# Patient Record
Sex: Female | Born: 1950 | Race: White | Hispanic: No | Marital: Married
Health system: Southern US, Community
[De-identification: ages and names within clinical notes are randomized; demographics above are authoritative.]

## PROBLEM LIST (undated history)

## (undated) DIAGNOSIS — E079 Disorder of thyroid, unspecified: Secondary | ICD-10-CM

## (undated) HISTORY — DX: Disorder of thyroid, unspecified: E07.9

---

## 2003-09-17 ENCOUNTER — Inpatient Hospital Stay (HOSPITAL_COMMUNITY): Admission: EM | Admit: 2003-09-17 | Discharge: 2003-09-23 | Payer: Self-pay | Admitting: Emergency Medicine

## 2003-09-18 ENCOUNTER — Encounter (INDEPENDENT_AMBULATORY_CARE_PROVIDER_SITE_OTHER): Payer: Self-pay | Admitting: Specialist

## 2004-02-29 ENCOUNTER — Ambulatory Visit (HOSPITAL_COMMUNITY): Admission: RE | Admit: 2004-02-29 | Discharge: 2004-02-29 | Payer: Self-pay | Admitting: Family Medicine

## 2004-03-04 ENCOUNTER — Ambulatory Visit (HOSPITAL_COMMUNITY): Admission: RE | Admit: 2004-03-04 | Discharge: 2004-03-04 | Payer: Self-pay | Admitting: Gastroenterology

## 2004-05-17 ENCOUNTER — Ambulatory Visit (HOSPITAL_COMMUNITY): Admission: RE | Admit: 2004-05-17 | Discharge: 2004-05-17 | Payer: Self-pay | Admitting: Family Medicine

## 2005-05-06 IMAGING — CT CT ABDOMEN W/ CM
1 of 4 series · 14 of 32 positions shown, 19 images · IV contrast (APPLIED)
Comparison: none

CLINICAL DATA: CT ABDOMEN WITH CONTRAST
Multidetector helical scans through the abdomen were performed after oral and IV contrast media were given.  150 cc of Omnipaque 300 were given as a contrast media.
The lung bases are clear.  The liver enhances normally with no intrahepatic ductal dilatation.  There is a small amount of fluid both medial and lateral to the liver.  The pancreas is grossly normal as are the adrenal glands and the spleen.  The kidneys enhance normal and on delayed images, the pelvocaliceal systems are normal.  No adenopathy is seen.  The abdominal aorta is normal in caliber.  
IMPRESSION 
Small amount of fluid surrounds the liver.  No acute intraabdominal abnormality seen.
CT PELVIS
Scans were continued through the pelvis after oral and IV contrast media were given. There is dilatation of small bowel loops within the pelvis.  In addition, in the right lower quadrant-right bony pelvis, there is fatty mass which appears to extend from the terminal ileum into the cecum.  This has attenuation in the minus 100 Hounsfield units and probably represents intussusception by lipoma of the distal terminal ileum into the cecum, creating a partial small bowel obstruction presently.  Urinary bladder is unremarkable.  There is a small amount of free fluid layering in the pelvis.  The appendix is not seen with certainty.
Prominent loops of small bowel in the pelvis with fatty mass at the terminal ileum-ileocecal valve region suspicious for intussusception of a lipoma from the distal small bowel into the cecum creating a partial small bowel obstruction. This report was called to Dr. Seydi at the time of interpretation.
Small amount of free fluid in the pelvis.

[Series 2: abd/pelvis 5.0 b30f · axial · 0.68mm/px · z∈[-509,-129]mm · 14 of 86 slices shown, 19 images]
[im 5/86  soft-tissue]
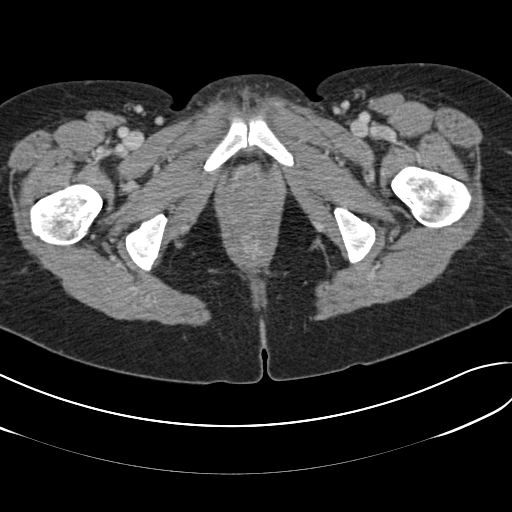
[im 5/86  bone]
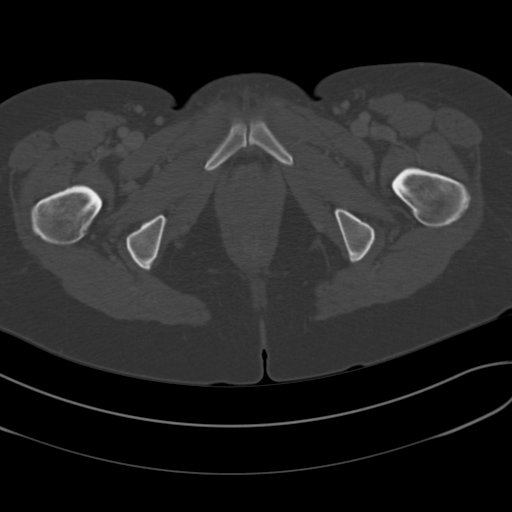
[im 10/86  soft-tissue]
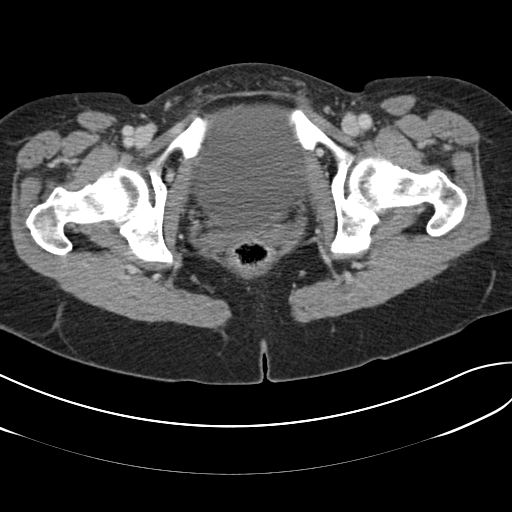
[im 19/86  soft-tissue]
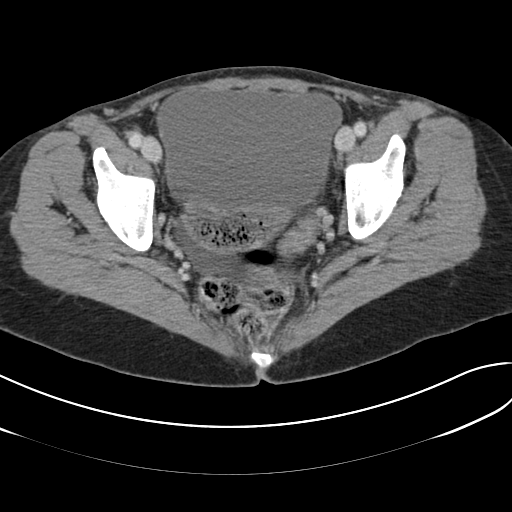
[im 24/86  soft-tissue]
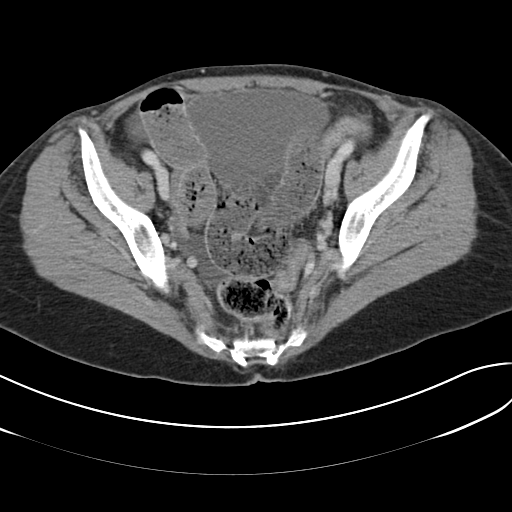
[im 29/86  soft-tissue]
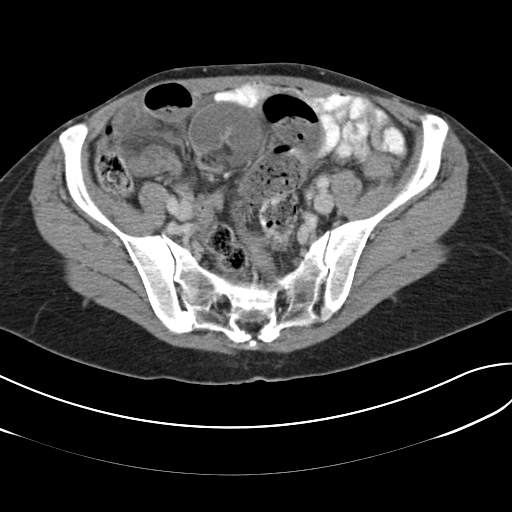
[im 38/86  soft-tissue]
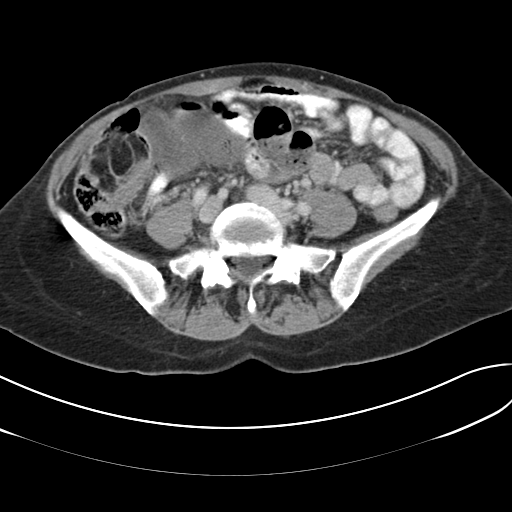
[im 43/86  soft-tissue]
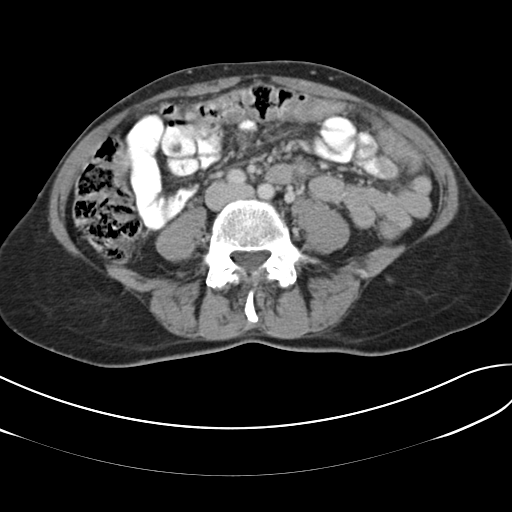
[im 48/86  soft-tissue]
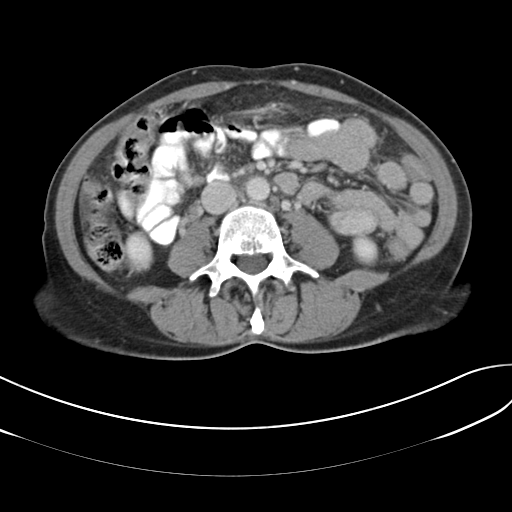
[im 57/86  soft-tissue]
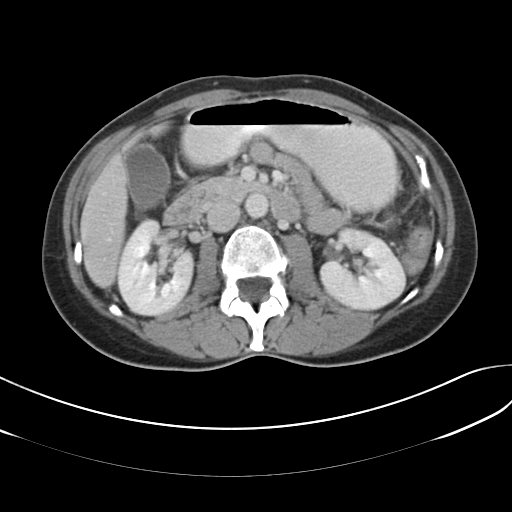
[im 57/86  bone]
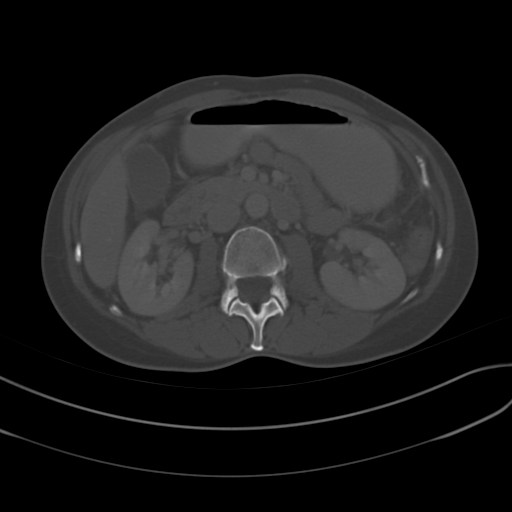
[im 62/86  soft-tissue]
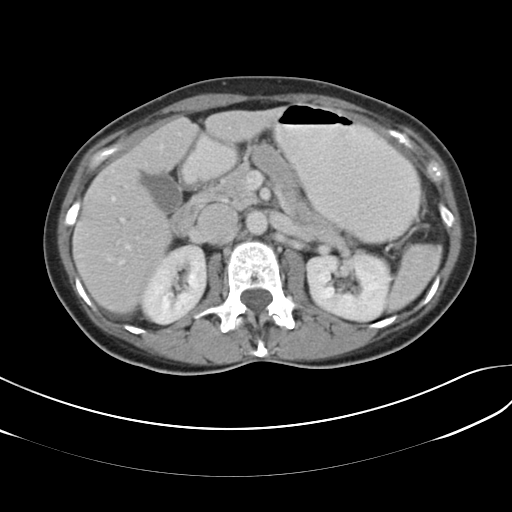
[im 67/86  soft-tissue]
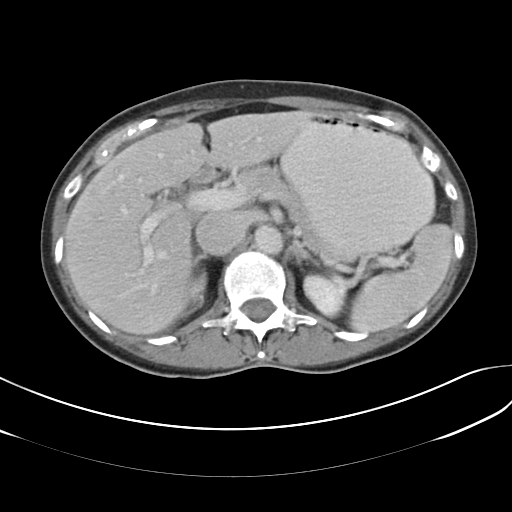
[im 67/86  lung]
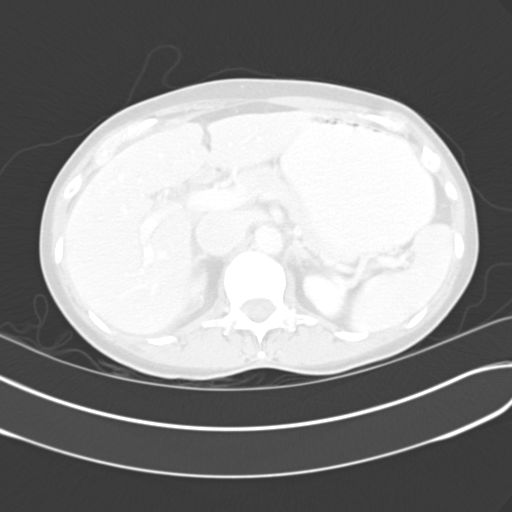
[im 71/86  lung]
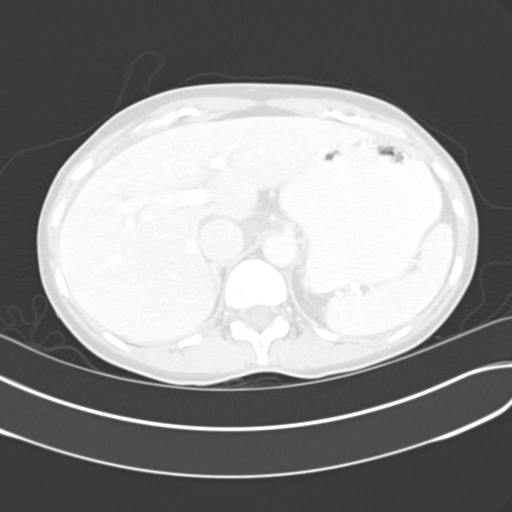
[im 76/86  soft-tissue]
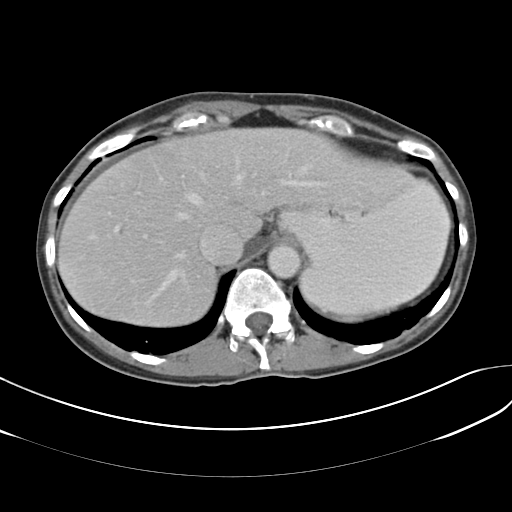
[im 76/86  lung]
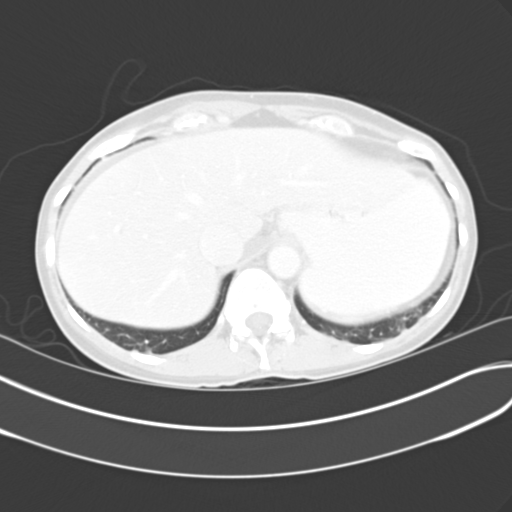
[im 81/86  soft-tissue]
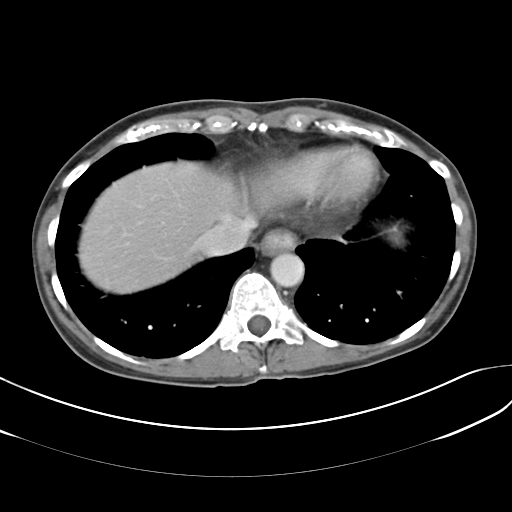
[im 81/86  lung]
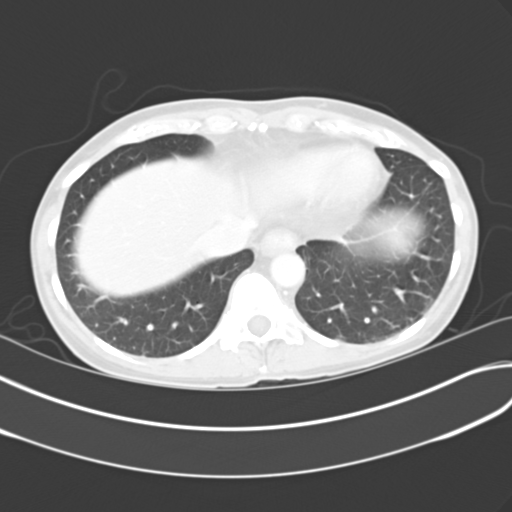

[14 of 32 positions shown; findings below may reference images not displayed]

## 2006-09-04 ENCOUNTER — Ambulatory Visit (HOSPITAL_COMMUNITY): Admission: RE | Admit: 2006-09-04 | Discharge: 2006-09-04 | Payer: Self-pay | Admitting: Family Medicine

## 2006-11-17 ENCOUNTER — Encounter: Admission: RE | Admit: 2006-11-17 | Discharge: 2006-11-17 | Payer: Self-pay | Admitting: Family Medicine

## 2010-10-04 NOTE — Op Note (Signed)
[name], HANOVER              ACCOUNT NO.:  [id number]   MEDICAL RECORD NO.:  [id number]          PATIENT TYPE:  AMB   LOCATION:  ENDO                         FACILITY:  [facility]   PHYSICIAN:  Bernette Redbird, M.D.   DATE OF BIRTH:  1950/09/29   DATE OF PROCEDURE:  03/04/2004  DATE OF DISCHARGE:                                 OPERATIVE REPORT   PROCEDURE:  Colonoscopy.   INDICATIONS:  Screening for colon cancer in a patient without risk factors  or symptoms but who is several months postoperative for an intussusception  terminal ileal lipoma necessitating terminal ileum resection.   FINDINGS:  Sigmoid diverticulosis.   PROCEDURE:  The nature, purpose, and risks of the procedure had been  discussed with the patient who provided written consent. Sedation was  fentanyl 50 mcg and Versed 5 mg IV without arrhythmias or desaturation. The  Olympus adjustable tension pediatric video colonoscope was advanced to the  cecum, with a fair amount of difficulty due to some fixation of the colon  probably related to adhesions from her prior hysterectomy. The orifice at  the terminal ileum was noted, and I was able to see into the terminal ileum  for a short distance, noting normal mucosal and no evidence of polypoid  lesions. Pull back was then performed. The quality of the prep was  excellent, and it was felt that all areas were well seen.   There was some mild sigmoid diverticulosis. No polyps, cancer, colitis, or  vascular malformations. Retroflexion was not attempted due to a small rectal  ampulla, but antegrade viewing disclosed no lesions. The lower portion of  the rectum was reinspected without additional findings. The scope was  removed from the patient who tolerated the procedure well and without  apparent complications.   IMPRESSION:  1.  Screening exam in a standard risk individual, without worrisome findings      (V76.51).  2.  Sigmoid diverticulosis.   PLAN:  Consider flexible  sigmoidoscopy in 5 years for ongoing screening.      RB/MEDQ  D:  03/04/2004  T:  03/04/2004  Job:  [id number]   cc:   Velora Heckler, MD  [address] [place]  [place] [address]  Fax: [phone]   Joycelyn Rua, M.D.  [address] [place]  [place] [address]  Fax: [phone]

## 2010-10-04 NOTE — H&P (Signed)
NAME:  ZETA, BUCY NO.:  [id number]   MEDICAL RECORD NO.:  [id number]                   PATIENT TYPE:  EMS   LOCATION:  ED                                   FACILITY:  [facility]   PHYSICIAN:  Velora Heckler, M.D.                DATE OF BIRTH:  1950/09/12   DATE OF ADMISSION:  09/17/2003  DATE OF DISCHARGE:                                HISTORY & PHYSICAL   REFERRING PHYSICIAN:  Bethann Berkshire, MD, emergency department.   CHIEF COMPLAINT:  Abdominal pain, small bowel obstruction.   HISTORY OF PRESENT ILLNESS:  Patient is a 60 year old white female recently  relocated from [place] to [place], who presents to the emergency  department early on the morning of May 1 with lower abdominal pain.  Patient  has had two prior episodes of similar symptoms, 3-1/2 and 4 years ago.  Workup in [place] and [place] was unrevealing.  Patient was awakened from  sleep early this morning by sudden onset of acute lower abdominal pain.  This was associated with nausea and vomiting.  She denied fevers and chills.  She had a normal bowel movement yesterday.  She denies any history of  bleeding per rectum.  She notes no change in her bowel habits.  She  presented to the emergency department at [facility].  Laboratory  studies were largely normal with the exception of a slightly elevated  amylase with a normal lipase.  CT scan of the abdomen and pelvis was  obtained.  This shows a soft tissue mass which appears to be a lipoma in the  distal ileum, which appears to have intussuscepted through the ileocecal  valve into the cecum and is obstructing at the level of the ileocecal valve.  Patient was seen by Dr. Roosvelt Harps in consultation.  General surgery  was then called for resection of the mass and relief of bowel obstruction.   PAST MEDICAL HISTORY:  1. Status post abdominal hysterectomy in 2001 at [facility].  2. 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   MEDICATIONS:  Thyroid hormone replacement.   ALLERGIES:  None known.   SOCIAL HISTORY:  Patient is married.  She is accompanied by her husband.  They have recently relocated to [facility].  They are buying [employer].  Patient denies tobacco use.  She drinks a glass of wine on rare  occasion.   FAMILY HISTORY:  Both parents deceased from congestive heart failure.   REVIEW OF SYSTEMS:  The 15-system review without significant other problems  except as noted above.  No history of ulcerative colitis or inflammatory  bowel disease.   PHYSICAL EXAMINATION:  VITAL SIGNS:  Temp 97.5, pulse 77, respirations 18,  blood pressure 143/94.  O2 saturation 99% on room air.  GENERAL:  A 60 year old well-developed and well-nourished white female on a  stretcher in the emergency department.  Somnolent.  HEENT:  Normocephalic and atraumatic.  Sclerae are clear.  Conjunctivae are  clear. Pupils are equal and reactive.  Dentition is good.  Mucous membranes  are moist.  NECK:  A well-healed surgical wound anteriorly.  Palpation shows no  nodularity.  There is no lymphadenopathy.  There are no masses.  There is no  tenderness.  LUNGS:  Clear to auscultation bilaterally without rales, rhonchi or wheezes.  HEART:  Regular rate and rhythm without murmur.  Peripheral pulses are full.  ABDOMEN:  Soft without distention.  Bowel sounds are present.  There is  tenderness to palpation in the right lower quadrant.  There is mild  tenderness to percussion in the right lower quadrant. There is voluntary  guarding in the right lower quadrant.  There is no rebound.  Mass is not  appreciated on physical exam.  There is no sign of hernia.  There is a well-  healed Pfannenstiel incision.  EXTREMITIES:  Nontender without edema.  NEUROLOGIC:  Patient is alert and oriented without focal deficit.   LABORATORY STUDIES:  White count 8.3, hemoglobin 15.0, platelets  215,000.  Differential shows 85% neutrophils, 10% lymphocytes.  Urinalysis is benign.  Chemistry profile is notable for an SGOT of 52 but otherwise completely  within normal limits.  Amylase is elevated at 150.  Lipase is normal at 21.   RADIOGRAPHIC STUDIES:  CT scan of the abdomen and pelvis reviewed with the  radiologist.  This shows a soft tissue mass measuring 6-8 cm in greatest  dimension, extending into the ileocecal valve and into the cecum.  It  appears to be intussusceptive.  It is partially obstructing.  Distal small  bowel is dilated.  This is consistent with an ileal lipoma which has  intussuscepted through the ileocecal valve, resulting in partial small bowel  obstruction.   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   PLAN:  1. Admission to [facility].  2. N.P.O. with intravenous hydration.  3. Oral antibiotic prep.  4. Fleet enema.  5. Preparation for OR on Sep 18, 2003 for ileocecectomy.   Risks and benefits of the procedure have been discussed with the husband and  the patient at the bedside.  They understand the nature of the abnormality  and the implications for surgery.  I answered all of their questions.  We  will proceed on May 2nd.                                               Velora Heckler, M.D.    TMG/MEDQ  D:  09/17/2003  T:  09/17/2003  Job:  [id number]   cc:   Althea Grimmer. Luther Parody, M.D.  [address]  [place]  [place] [address]  Fax: [phone]

## 2010-10-04 NOTE — Discharge Summary (Signed)
NAMEMarland Kitchen  Rangel, Susan                        ACCOUNT NO.:  [id number]   MEDICAL RECORD NO.:  [id number]                   PATIENT TYPE:  INP   LOCATION:  0372                                 FACILITY:  [facility]   PHYSICIAN:  Velora Heckler, M.D.                DATE OF BIRTH:  July 11, 1950   DATE OF ADMISSION:  09/17/2003  DATE OF DISCHARGE:  09/23/2003                                 DISCHARGE SUMMARY   REASON FOR ADMISSION:  Abdominal pain, small-bowel obstruction.   BRIEF HISTORY:  The patient is a 60 year old white female recently relocated  from [place] to [place].  She presents to the emergency department with  lower abdominal pain on the morning of May 1st.  She had had 2 prior  episodes 3-1/2 and 4 years ago.  The patient noted sudden onset of acute  lower abdominal pain associated with nausea and vomiting.  The patient was  seen in [facility] Emergency Department.  A CT scan of the abdomen and  pelvis demonstrated a soft tissue mass consistent with lipoma in the distal  ileum which appears to have been intussuscepted through the ileocecal valve  causing obstruction at that level.  The patient was seen by gastroenterology  in consultation.  General surgery was then called for surgical intervention.   HOSPITAL COURSE:  The patient was admitted on the surgical service.  She  underwent oral antibiotic bowel prep.  She was taken to the operating room  on the morning of Sep 18, 2003 where she underwent ileocecectomy.  Operative  findings included a large multilobulated lipoma involving the terminal ileum  and cecum with obstruction at the ileocecal valve.  Postoperatively, the  patient did well.  She started on a clear liquid diet on the second  postoperative day.  She advanced to full liquid diet and finally a regular  diet by the fourth postoperative day.  She was prepared for a discharge home  on the fifth postoperative day.   DISCHARGE PLANNING:  The patient is discharged home  Sep 23, 2003 in good  condition, tolerating a regular diet, and ambulating independently.  She  will be seen back at my office in 3 days for wound check and staple removal.   DISCHARGE MEDICATIONS:  Included Vicodin as needed for pain.   FINAL DIAGNOSES:  Lipoma involving ileocecal valve with obstruction.   CONDITION ON DISCHARGE:  Improved.                                               Velora Heckler, M.D.    TMG/MEDQ  D:  09/29/2003  T:  09/29/2003  Job:  [id number]   cc:   Althea Grimmer. Luther Parody, M.D.  [address]  [place]  [place] [address]  Fax:  [phone] 

## 2010-10-04 NOTE — Op Note (Signed)
NAME:  Susan Rangel, MESSENGER                        ACCOUNT NO.:  [id number]   MEDICAL RECORD NO.:  [id number]                   PATIENT TYPE:  INP   LOCATION:  0372                                 [facility]ACILITY[facility]  [facility]   PHYSICIAN:  Velora Heckler, M.D.                DATE OF BIRTH:  12/25/1950   DATE OF PROCEDURE:  09/18/2003  DATE OF DISCHARGE:                                 OPERATIVE REPORT   PREOPERATIVE DIAGNOSIS:  Small bowel obstruction, mass at ileocecal valve.   POSTOPERATIVE DIAGNOSIS:  Small bowel obstruction, lipomatous mass crossing  ileocecal valve involving terminal ileum and cecum.   OPERATION/PROCEDURE:  1. Exploratory laparotomy with lysis of adhesions.  2. Ileocecectomy.   SURGEON:  Velora Heckler, M.D.   ASSISTANT:  Sheppard Plumber. Earlene Plater, M.D.   ANESTHESIA:  General per Dr. Mack Guise.   ESTIMATED BLOOD LOSS:  Minimal.   PREPARATION:  Betadine.   COMPLICATIONS:  None.   INDICATIONS:  The patient is a 60 year old white female who presents to the  emergency department with lower abdominal pain.  Work-up included CT scan of  the abdomen and pelvis which demonstrated a soft tissue mass crossing the  ileocecal valve with resultant small bowel obstruction.  The patient now  comes to the surgery for resection.   DESCRIPTION OF PROCEDURE:  The procedure is done in OR #6 at the [facility].  The patient is brought to the operating room and placed  in the supine position on the operating room table.  Following  administration of general anesthesia, the patient is prepped and draped in  the usual strict aseptic fashion.   After ascertaining that an adequate level of anesthesia had been obtained, a  lower midline incision is made with #10 blade.  Dissection is carried  through the subcutaneous tissues.  Fascia is incised in the midline.  The  peritoneal cavity is entered cautiously.  Approximately 600 mL of ascitic  fluid is present within the  peritoneal cavity.  This is evacuated.  There  are few adhesions in the right lower quadrant which are lysed with the  Metzenbaum scissors.  Balfour retractor is placed for exposure and the  abdomen is explored.  Cecum is delivered up and into the wound.  Appendix is  grossly normal.  There is a soft tissue mass involving the terminal ileum  for approximately 5 cm and extending across the ileocecal valve into the  cecum where there is a soft mass measuring approximately 5 cm in greatest  dimension.  Small bowel is run retrograde from the ileocecal valve back to  the ligament of Treitz.  The distal half of the small bowel was markedly  distended and dilated.  There is no sign of inflammatory bowel disease.  There is no sign of Meckel's diverticulum.  Sigmoid colon is redundant but  normal.  Pelvis shows the uterus to be surgically absent.  The cecum is mobilized from its lateral peritoneal attachments.  Appendix  was mobilized from its peritoneal attachments.  A point in the ileum just  proximal to the palpable mass is transected with a GIA stapler.  Mesentery  is taken down between [facility] clamps, divided, ligated with 2-0 silk ties.  Dissection is carried onto the ascending colon.  At this point the proximal  ascending colon just above the palpable mass and is transected with the GIA  stapler.  Specimen is passed to the back table where it is later opened.  Representative photographs are taken.  There appears to be a large,  lipomatous mass which arises on the terminal ileum but also is intimately  attached to the mucosal surface across the ileocecal valve and into the  cecum proper.  This does not represent intussusception.   Next, the anastomosis is created between the terminal ileum and ascending  colon using the GIA stapler.  Staple line is inspected for hemostasis.  Enterotomy is closed with a TA-60 stapler.  Mesenteric defect is closed with  interrupted 2-0 silk sutures.  Abdomen is  copiously irrigated with warm  saline which is evacuated.  Viscera are returned to their normal location.  Small bowel is covered with a thin veil of omentum.  Midline wound is closed  with a running #1 Novofil suture.  Subcutaneous tissue is irrigated and  hemostasis obtained with the electrocautery.  Skin is closed with stainless  steel staples.  Sterile dressings are applied.  The patient is awakened from  anesthesia and brought to the recovery room in stable condition.  The  patient tolerated the procedure well.                                               Velora Heckler, M.D.    TMG/MEDQ  D:  09/18/2003  T:  09/18/2003  Job:  [id number]   cc:   Althea Grimmer. Luther Parody, M.D.  [address]  [place]  [place] [address]  Fax: [phone]   Bethann Berkshire, MD  [address] [place], [place] [address]

## 2013-11-25 ENCOUNTER — Other Ambulatory Visit: Payer: Self-pay | Admitting: Family Medicine

## 2013-11-25 DIAGNOSIS — Z1231 Encounter for screening mammogram for malignant neoplasm of breast: Secondary | ICD-10-CM

## 2013-11-28 ENCOUNTER — Ambulatory Visit: Payer: Self-pay

## 2020-09-10 ENCOUNTER — Other Ambulatory Visit: Payer: Self-pay

## 2020-09-10 ENCOUNTER — Ambulatory Visit (INDEPENDENT_AMBULATORY_CARE_PROVIDER_SITE_OTHER): Payer: Medicare HMO | Admitting: Family Medicine

## 2020-09-10 ENCOUNTER — Ambulatory Visit: Payer: Self-pay

## 2020-09-10 ENCOUNTER — Encounter: Payer: Self-pay | Admitting: Family Medicine

## 2020-09-10 VITALS — BP 138/90 | Ht 67.0 in | Wt 160.0 lb

## 2020-09-10 DIAGNOSIS — M25572 Pain in left ankle and joints of left foot: Secondary | ICD-10-CM

## 2020-09-10 DIAGNOSIS — G8929 Other chronic pain: Secondary | ICD-10-CM | POA: Diagnosis not present

## 2020-09-10 NOTE — Patient Instructions (Signed)
You have posterior tibialis tendinitis with a very small partial tear. Arch supports are very important - avoid flat shoes and barefoot walking as much as possible. Icing 15 minutes at a time 3-4 times a day as needed. Topical voltaren gel up to 4 times a day. Start physical therapy and do home exercises on days you don't go to therapy. Consider nitro patches if not improving as expected. Follow up with me in 6 weeks.

## 2020-09-10 NOTE — Progress Notes (Signed)
PCP: Pieter Partridge., MD  Subjective:   HPI: Patient is a 69 y.o. female here for left ankle pain.  Patient has prior history of left posterior tibialis tendon rupture and issues dating back to 2000. At the time she may have turned ankle and has history of known flat feet. She saw an orthopedic in [place], put in boot then a brace and finally an orthotic. Did well until about 2005 when she saw Dr. Prince Rome for same issue - may have been placed in a boot at that time because she found a second boot at home, did physical therapy and improved. Current issue started in November when she stubbed her left 5th digit. She had a lot of swelling and bruising distal dorsal foot at the time and subsequently developed pain medial left ankle in same location as prior post tib problems. Using orthofeet shoes which help a lot with her foot pain but has noticed anterior knee pain with these.  History reviewed. No pertinent past medical history.  Current Outpatient Medications on File Prior to Visit  Medication Sig Dispense Refill  . chlorthalidone (HYGROTON) 25 MG tablet Take 25 mg by mouth daily.     No current facility-administered medications on file prior to visit.    History reviewed. No pertinent surgical history.  No Known Allergies  Social History   Socioeconomic History  . Marital status: Married    Spouse name: Not on file  . Number of children: Not on file  . Years of education: Not on file  . Highest education level: Not on file  Occupational History  . Not on file  Tobacco Use  . Smoking status: Not on file  . Smokeless tobacco: Not on file  Substance and Sexual Activity  . Alcohol use: Not on file  . Drug use: Not on file  . Sexual activity: Not on file  Other Topics Concern  . Not on file  Social History Narrative  . Not on file   Social Determinants of Health   Financial Resource Strain: Not on file  Food Insecurity: Not on file  Transportation Needs: Not on  file  Physical Activity: Not on file  Stress: Not on file  Social Connections: Not on file  Intimate Partner Violence: Not on file    History reviewed. No pertinent family history.  BP 138/90   Ht 5\' 7"  (1.702 m)   Wt 160 lb (72.6 kg)   BMI 25.06 kg/m   No flowsheet data found.  No flowsheet data found.  Review of Systems: See HPI above.     Objective:  Physical Exam:  Gen: NAD, comfortable in exam room  Left ankle: Pes planus.  Mild diffuse soft tissue swelling.  No other gross deformity, ecchymoses FROM except 4/5 strength resisted IR.  Post tib dysfunction on heel raise. Mild TTP over post tib tendon, less plantar fascia insertion proximally. Negative syndesmotic compression. Thompsons test negative. NV intact distally.  Limited MSK u/s left ankle:  Post tib tendon severely thickened with neovascularity distally near insertion.  Tenosynovitis near distal insertion as well.  Small partial thickness tear at level of ankle but < 5% tendon area.     Assessment & Plan:  1. Left ankle pain - 2/2 posterior tibialis tendinitis with very small partial thickness tear.  Stressed importance of arch supports.  Icing, topical voltaren gel.  Start physical therapy and home exercises.  F/u in 6 weeks.  Consider nitro patches.    She is also getting  some patellar tendinopathy - inserts do not have a large drop to suggest this is playing a major role.  Will monitor - can add physical therapy for this as well if needed.

## 2020-10-25 ENCOUNTER — Other Ambulatory Visit: Payer: Self-pay

## 2020-10-29 ENCOUNTER — Encounter: Payer: Self-pay | Admitting: Family Medicine

## 2020-10-29 ENCOUNTER — Ambulatory Visit: Payer: Medicare HMO | Admitting: Family Medicine

## 2020-10-29 ENCOUNTER — Other Ambulatory Visit: Payer: Self-pay

## 2020-10-29 VITALS — BP 140/98 | Ht 67.0 in | Wt 160.0 lb

## 2020-10-29 DIAGNOSIS — G8929 Other chronic pain: Secondary | ICD-10-CM | POA: Diagnosis not present

## 2020-10-29 DIAGNOSIS — M25572 Pain in left ankle and joints of left foot: Secondary | ICD-10-CM | POA: Diagnosis not present

## 2020-10-29 NOTE — Progress Notes (Signed)
PCP: Pieter Partridge., MD  Subjective:   HPI: Patient is a 70 y.o. female here for left ankle pain.  4/25: Patient has prior history of left posterior tibialis tendon rupture and issues dating back to 2000. At the time she may have turned ankle and has history of known flat feet. She saw an orthopedic in [place], put in boot then a brace and finally an orthotic. Did well until about 2005 when she saw Dr. Prince Rome for same issue - may have been placed in a boot at that time because she found a second boot at home, did physical therapy and improved. Current issue started in November when she stubbed her left 5th digit. She had a lot of swelling and bruising distal dorsal foot at the time and subsequently developed pain medial left ankle in same location as prior post tib problems. Using orthofeet shoes which help a lot with her foot pain but has noticed anterior knee pain with these.  6/13: Patient reports she's improving with physical therapy, home exercises, and her orthotics. Additional long arch support and medial heel wedge were added to left insert. She has noticed some increased pain right ankle medially. In most recent PT visit Friday had deep tissue work and this worsened her pain though it's improving. Less pain when not wearing shoes and does feel more stable than previously.  History reviewed. No pertinent past medical history.  Current Outpatient Medications on File Prior to Visit  Medication Sig Dispense Refill   chlorthalidone (HYGROTON) 25 MG tablet Take 25 mg by mouth daily.     Levothyroxine Sodium 175 MCG CAPS Tirosint 175 mcg capsule  TAKE ONE CAPSULE BY MOUTH ONCE A DAY     TIROSINT 150 MCG CAPS Take 1 capsule by mouth daily.     No current facility-administered medications on file prior to visit.    History reviewed. No pertinent surgical history.  No Known Allergies  Social History   Socioeconomic History   Marital status: Married    Spouse name: Not  on file   Number of children: Not on file   Years of education: Not on file   Highest education level: Not on file  Occupational History   Not on file  Tobacco Use   Smoking status: Not on file   Smokeless tobacco: Not on file  Substance and Sexual Activity   Alcohol use: Not on file   Drug use: Not on file   Sexual activity: Not on file  Other Topics Concern   Not on file  Social History Narrative   Not on file   Social Determinants of Health   Financial Resource Strain: Not on file  Food Insecurity: Not on file  Transportation Needs: Not on file  Physical Activity: Not on file  Stress: Not on file  Social Connections: Not on file  Intimate Partner Violence: Not on file    History reviewed. No pertinent family history.  BP (!) 140/98   Ht 5\' 7"  (1.702 m)   Wt 160 lb (72.6 kg)   BMI 25.06 kg/m   No flowsheet data found.  No flowsheet data found.  Review of Systems: See HPI above.     Objective:  Physical Exam:  Gen: NAD, comfortable in exam room  Left ankle: Pes planus.  Mild diffuse soft tissue swelling.  No other gross deformity, ecchymoses FROM without pain on IR, improved.  Mild post tib dysfunction, improved also. Mild TTP over post tib tendon. Thompsons test negative. NV  intact distally.  Right ankle: Pes planus.  Mild diffuse soft tissue swelling.  No other gross deformity, ecchymoses FROM without pain. No TTP Thompsons test negative. NV intact distally.  Assessment & Plan:  1. Left ankle pain - 2/2 posterior tibialis tendinitis with small partial thickness tear.  Improving with physical therapy and home exercise program.  Continue with these.  Arch supports - may need to make similar adjustments to right inserts.  Consider nitro patches if not improving.  F/u in 6 weeks or as needed.

## 2020-10-29 NOTE — Patient Instructions (Signed)
Continue with the home exercises and therapy though I'd do so without the deep tissue work. Continue the arch supports you have - if the right one worsens I'd recommend similar extra padding to this side (additional arch support and medial heel wedge). Consider nitro patches if not improving as expected. Follow up with me in 6 weeks or as needed if you continue to steadily improve.

## 2020-11-15 ENCOUNTER — Encounter (HOSPITAL_BASED_OUTPATIENT_CLINIC_OR_DEPARTMENT_OTHER): Payer: Self-pay | Admitting: Emergency Medicine

## 2020-11-15 ENCOUNTER — Other Ambulatory Visit: Payer: Self-pay

## 2020-11-15 ENCOUNTER — Observation Stay (HOSPITAL_BASED_OUTPATIENT_CLINIC_OR_DEPARTMENT_OTHER)
Admission: AD | Admit: 2020-11-15 | Discharge: 2020-11-16 | Disposition: A | Discharge status: Home / Self Care | Payer: Medicare HMO | Attending: Internal Medicine | Admitting: Internal Medicine

## 2020-11-15 DIAGNOSIS — E871 Hypo-osmolality and hyponatremia: Principal | ICD-10-CM | POA: Diagnosis present

## 2020-11-15 DIAGNOSIS — E039 Hypothyroidism, unspecified: Secondary | ICD-10-CM | POA: Diagnosis not present

## 2020-11-15 DIAGNOSIS — R531 Weakness: Secondary | ICD-10-CM | POA: Insufficient documentation

## 2020-11-15 DIAGNOSIS — U071 COVID-19: Secondary | ICD-10-CM

## 2020-11-15 DIAGNOSIS — I10 Essential (primary) hypertension: Secondary | ICD-10-CM

## 2020-11-15 DIAGNOSIS — R112 Nausea with vomiting, unspecified: Secondary | ICD-10-CM | POA: Diagnosis present

## 2020-11-15 DIAGNOSIS — E876 Hypokalemia: Secondary | ICD-10-CM | POA: Diagnosis not present

## 2020-11-15 DIAGNOSIS — E860 Dehydration: Secondary | ICD-10-CM | POA: Diagnosis not present

## 2020-11-15 LAB — COMPREHENSIVE METABOLIC PANEL
ALT: 30 U/L (ref 0–44)
AST: 30 U/L (ref 15–41)
Albumin: 4.3 g/dL (ref 3.5–5.0)
Alkaline Phosphatase: 59 U/L (ref 38–126)
Anion gap: 10 (ref 5–15)
BUN: 5 mg/dL — ABNORMAL LOW (ref 8–23)
CO2: 29 mmol/L (ref 22–32)
Calcium: 9.3 mg/dL (ref 8.9–10.3)
Chloride: 83 mmol/L — ABNORMAL LOW (ref 98–111)
Creatinine, Ser: 0.57 mg/dL (ref 0.44–1.00)
GFR, Estimated: >60 mL/min (ref >60)
Glucose, Bld: 110 mg/dL — ABNORMAL HIGH (ref 70–99)
Potassium: 2.8 mmol/L — ABNORMAL LOW (ref 3.5–5.1)
Sodium: 122 mmol/L — ABNORMAL LOW (ref 135–145)
Total Bilirubin: 0.9 mg/dL (ref 0.3–1.2)
Total Protein: 6.7 g/dL (ref 6.5–8.1)

## 2020-11-15 LAB — CBC WITH DIFFERENTIAL/PLATELET
Abs Immature Granulocytes: 0.01 10*3/uL (ref 0.00–0.07)
Basophils Absolute: 0 10*3/uL (ref 0.0–0.1)
Basophils Relative: 0 %
Eosinophils Absolute: 0 10*3/uL (ref 0.0–0.5)
Eosinophils Relative: 0 %
HCT: 41.1 % (ref 36.0–46.0)
Hemoglobin: 15 g/dL (ref 12.0–15.0)
Immature Granulocytes: 0 %
Lymphocytes Relative: 28 %
Lymphs Abs: 1.2 10*3/uL (ref 0.7–4.0)
MCH: 31.6 pg (ref 26.0–34.0)
MCHC: 36.5 g/dL — ABNORMAL HIGH (ref 30.0–36.0)
MCV: 86.5 fL (ref 80.0–100.0)
Monocytes Absolute: 0.5 10*3/uL (ref 0.1–1.0)
Monocytes Relative: 12 %
Neutro Abs: 2.5 10*3/uL (ref 1.7–7.7)
Neutrophils Relative %: 60 %
Platelets: 233 10*3/uL (ref 150–400)
RBC: 4.75 MIL/uL (ref 3.87–5.11)
RDW: 11.4 % — ABNORMAL LOW (ref 11.5–15.5)
WBC: 4.3 10*3/uL (ref 4.0–10.5)
nRBC: 0 % (ref 0.0–0.2)

## 2020-11-15 LAB — URINALYSIS, ROUTINE W REFLEX MICROSCOPIC
Bilirubin Urine: NEGATIVE
Glucose, UA: NEGATIVE mg/dL
Ketones, ur: 15 mg/dL — AB
Leukocytes,Ua: NEGATIVE
Nitrite: NEGATIVE
Protein, ur: NEGATIVE mg/dL
Specific Gravity, Urine: <1.005 — ABNORMAL LOW (ref 1.005–1.030)
pH: 7 (ref 5.0–8.0)

## 2020-11-15 LAB — BASIC METABOLIC PANEL
Anion gap: 10 (ref 5–15)
Anion gap: 9 (ref 5–15)
BUN: 5 mg/dL — ABNORMAL LOW (ref 8–23)
BUN: <5 mg/dL — ABNORMAL LOW (ref 8–23)
CO2: 28 mmol/L (ref 22–32)
CO2: 28 mmol/L (ref 22–32)
Calcium: 9 mg/dL (ref 8.9–10.3)
Calcium: 8.9 mg/dL (ref 8.9–10.3)
Chloride: 88 mmol/L — ABNORMAL LOW (ref 98–111)
Chloride: 94 mmol/L — ABNORMAL LOW (ref 98–111)
Creatinine, Ser: 0.54 mg/dL (ref 0.44–1.00)
Creatinine, Ser: 0.57 mg/dL (ref 0.44–1.00)
GFR, Estimated: >60 mL/min (ref >60)
GFR, Estimated: >60 mL/min (ref >60)
Glucose, Bld: 108 mg/dL — ABNORMAL HIGH (ref 70–99)
Glucose, Bld: 149 mg/dL — ABNORMAL HIGH (ref 70–99)
Potassium: 2.9 mmol/L — ABNORMAL LOW (ref 3.5–5.1)
Potassium: 3.4 mmol/L — ABNORMAL LOW (ref 3.5–5.1)
Sodium: 126 mmol/L — ABNORMAL LOW (ref 135–145)
Sodium: 131 mmol/L — ABNORMAL LOW (ref 135–145)

## 2020-11-15 LAB — LIPASE, BLOOD: Lipase: 32 U/L (ref 11–51)

## 2020-11-15 LAB — RESP PANEL BY RT-PCR (FLU A&B, COVID) ARPGX2
Influenza A by PCR: NEGATIVE
Influenza B by PCR: NEGATIVE
SARS Coronavirus 2 by RT PCR: POSITIVE — AB

## 2020-11-15 LAB — MAGNESIUM: Magnesium: 1.8 mg/dL (ref 1.7–2.4)

## 2020-11-15 LAB — TROPONIN I (HIGH SENSITIVITY)
Troponin I (High Sensitivity): 8 ng/L (ref <18)
Troponin I (High Sensitivity): 10 ng/L (ref <18)

## 2020-11-15 LAB — GLUCOSE, CAPILLARY: Glucose-Capillary: 106 mg/dL — ABNORMAL HIGH (ref 70–99)

## 2020-11-15 MED ORDER — POTASSIUM CHLORIDE CRYS ER 20 MEQ PO TBCR
40.0000 meq | EXTENDED_RELEASE_TABLET | Freq: Once | ORAL | Status: AC
  Administered 2020-11-15: 40 meq via ORAL
  Filled 2020-11-15: qty 2

## 2020-11-15 MED ORDER — METHOCARBAMOL 500 MG PO TABS
1000.0000 mg | ORAL_TABLET | Freq: Once | ORAL | Status: AC
  Administered 2020-11-15: 1000 mg via ORAL
  Filled 2020-11-15: qty 2

## 2020-11-15 MED ORDER — ENOXAPARIN SODIUM 40 MG/0.4ML IJ SOSY
40.0000 mg | PREFILLED_SYRINGE | INTRAMUSCULAR | Status: DC
  Administered 2020-11-15: 40 mg via SUBCUTANEOUS
  Filled 2020-11-15: qty 0.4

## 2020-11-15 MED ORDER — LACTATED RINGERS IV BOLUS
1000.0000 mL | Freq: Once | INTRAVENOUS | Status: AC
  Administered 2020-11-15: 1000 mL via INTRAVENOUS

## 2020-11-15 MED ORDER — DICYCLOMINE HCL 10 MG/ML IM SOLN
20.0000 mg | Freq: Once | INTRAMUSCULAR | Status: AC
Start: 2020-11-15 — End: 2020-11-15
  Administered 2020-11-15: 20 mg via INTRAMUSCULAR
  Filled 2020-11-15: qty 2

## 2020-11-15 MED ORDER — ONDANSETRON HCL 4 MG/2ML IJ SOLN: 4.0000 mg | Freq: Four times a day (QID) | INTRAMUSCULAR | Status: DC | PRN

## 2020-11-15 MED ORDER — METHOCARBAMOL 1000 MG/10ML IJ SOLN
1000.0000 mg | Freq: Once | INTRAMUSCULAR | Status: DC
  Filled 2020-11-15: qty 10

## 2020-11-15 MED ORDER — ALUM & MAG HYDROXIDE-SIMETH 200-200-20 MG/5ML PO SUSP
30.0000 mL | Freq: Once | ORAL | Status: AC
  Administered 2020-11-15: 30 mL via ORAL
  Filled 2020-11-15: qty 30

## 2020-11-15 MED ORDER — METOCLOPRAMIDE HCL 5 MG/ML IJ SOLN
10.0000 mg | Freq: Once | INTRAMUSCULAR | Status: AC
  Administered 2020-11-15: 10 mg via INTRAVENOUS
  Filled 2020-11-15: qty 2

## 2020-11-15 MED ORDER — SODIUM CHLORIDE 0.9 % IV SOLN: INTRAVENOUS | Status: DC | PRN

## 2020-11-15 MED ORDER — DIPHENHYDRAMINE HCL 50 MG/ML IJ SOLN
25.0000 mg | Freq: Once | INTRAMUSCULAR | Status: AC
Start: 2020-11-15 — End: 2020-11-15
  Administered 2020-11-15: 25 mg via INTRAVENOUS
  Filled 2020-11-15: qty 1

## 2020-11-15 MED ORDER — POTASSIUM CHLORIDE 10 MEQ/100ML IV SOLN
10.0000 meq | INTRAVENOUS | Status: AC
  Administered 2020-11-15 (×2): 10 meq via INTRAVENOUS
  Filled 2020-11-15 (×2): qty 100

## 2020-11-15 MED ORDER — LIDOCAINE VISCOUS HCL 2 % MT SOLN
15.0000 mL | Freq: Once | OROMUCOSAL | Status: AC
  Administered 2020-11-15: 15 mL via ORAL
  Filled 2020-11-15: qty 15

## 2020-11-15 MED ORDER — HYDRALAZINE HCL 20 MG/ML IJ SOLN: 5.0000 mg | Freq: Four times a day (QID) | INTRAMUSCULAR | Status: DC | PRN

## 2020-11-15 MED ORDER — SODIUM CHLORIDE 0.9 % IV SOLN: INTRAVENOUS | Status: DC

## 2020-11-15 MED ORDER — MAGNESIUM OXIDE -MG SUPPLEMENT 400 (240 MG) MG PO TABS
400.0000 mg | ORAL_TABLET | Freq: Once | ORAL | Status: AC
  Administered 2020-11-15: 400 mg via ORAL
  Filled 2020-11-15: qty 1

## 2020-11-15 NOTE — ED Triage Notes (Signed)
Pt started experiencing Covid symptoms on June 22 ans tested positive on June 25. Pt took paxlovid prescribed by her PCP on Sunday night. Pt stated that 1/2 hour after taking the medication she experienced N/V and generalized body aches. Same symptoms have continued since that time.   Pt states that she has more of "pressure" through out her body rarther than generalized body aches.

## 2020-11-15 NOTE — Plan of Care (Signed)
Received call from: [facility] EDP: Dr. Dalene Seltzer  Briefly 70 year old female who started having COVID symptoms 8 days ago and tested +5 days ago, started Paxlovid 4 days ago.  However developed nausea vomiting generalized body aches, fevers.  No shortness of breath or coughing.  Patient reports constant nausea, poor p.o. intake  BP (!) 153/105   Pulse 78   Temp 98.7 F (37.1 C) (Oral)   Resp 17   Ht 5\' 7"  (1.702 m)   Wt 63.5 kg   SpO2 98%   BMI 21.93 kg/m   On labs sodium 122, potassium 2.8  -Will need repeat COVID testing, unable to find the positive test in the system -Received 1 L Ringer lactate, placed on normal saline at 75 cc/ hour.  Has received potassium replacement -Requested Dr. to repeat BMET prior to the transfer  -Will need airborne precautions and COVID room  Accepted to isolation room, COVID-positive, progressive floor due to severe electrolyte abnormalities   Alois Mincer M.D.  [facility] 11/15/2020, 2:44 PM

## 2020-11-15 NOTE — ED Provider Notes (Signed)
[facility] EMERGENCY DEPT Provider Note   CSN: [id number] Arrival date & time: 11/15/20  1107     History Chief Complaint  Patient presents with   Generalized Body Aches    Susan Rangel is a 70 y.o. female.  HPI     70yo female with history of thyroid disease, tested positive for COVID June 25th (symptoms started 22nd), took the paxlovid 4 days ago and developed nausea, vomiting, generalized body aches.   Initially having achiness, fever (fever gone now), no shortness of breath or cough, no runny nose  30 minutes after taking paxlovid began to have nausea, vomiting abdominal pain. Feels uncomfortable all over.Only threw up once and one other day.  Nausea has been constant since Sunday, body aches.  Has had continued symptoms of nausea, vomiting, body aches.   Past Medical History:  Diagnosis Date   Thyroid disease     Patient Active Problem List   Diagnosis Date Noted   Hypokalemia 11/16/2020   COVID-19 virus infection 11/16/2020   HTN (hypertension) 11/16/2020   Hypothyroidism 11/16/2020   Hyponatremia 11/15/2020    Past Surgical History:  Procedure Laterality Date   ABDOMINAL SURGERY       OB History   No obstetric history on file.     History reviewed. No pertinent family history.     Home Medications Prior to Admission medications   Medication Sig Start Date End Date Taking? Authorizing Provider  chlorthalidone (HYGROTON) 25 MG tablet Take 25 mg by mouth daily. 08/06/20  Yes [provider]  Levothyroxine Sodium 175 MCG CAPS Take 175 mcg by mouth every other day.   Yes [provider]  TIROSINT 150 MCG CAPS Take 150 mcg by mouth every other day. 10/08/20  Yes [provider]    Allergies    Patient has no known allergies.  Review of Systems   Review of Systems  Constitutional:  Positive for appetite change and fatigue. Negative for fever.  HENT:  Negative for congestion.   Respiratory:  Negative for  cough and shortness of breath.   Cardiovascular:  Negative for chest pain.  Gastrointestinal:  Positive for abdominal pain, diarrhea (after paxlovid 2 episodes but has not eated much) and nausea. Vomiting: 2 episodes. Genitourinary:  Negative for dysuria.  Musculoskeletal:  Positive for arthralgias and myalgias.  Skin:  Negative for rash.  Neurological:  Negative for dizziness, light-headedness and headaches.   Physical Exam Updated Vital Signs BP (!) 147/90   Pulse 67   Temp 97.8 F (36.6 C) (Oral)   Resp 20   Ht 5\' 7"  (1.702 m)   Wt 64.1 kg   SpO2 99%   BMI 22.13 kg/m   Physical Exam Vitals and nursing note reviewed.  Constitutional:      General: She is not in acute distress.    Appearance: She is well-developed. She is ill-appearing. She is not diaphoretic.  HENT:     Head: Normocephalic and atraumatic.  Eyes:     Conjunctiva/sclera: Conjunctivae normal.  Cardiovascular:     Rate and Rhythm: Normal rate and regular rhythm.     Heart sounds: Normal heart sounds. No murmur heard.   No friction rub. No gallop.  Pulmonary:     Effort: Pulmonary effort is normal. No respiratory distress.     Breath sounds: No stridor. No wheezing.  Abdominal:     General: There is no distension.     Palpations: Abdomen is soft.     Tenderness: There is  no abdominal tenderness. There is no guarding.  Musculoskeletal:        General: No tenderness.     Cervical back: Normal range of motion.  Skin:    General: Skin is warm and dry.     Findings: No erythema or rash.  Neurological:     Mental Status: She is alert and oriented to person, place, and time.    ED Results / Procedures / Treatments   Labs (all labs ordered are listed, but only abnormal results are displayed) Labs Reviewed  RESP PANEL BY RT-PCR (FLU A&B, COVID) ARPGX2 - Abnormal; Notable for the following components:      Result Value   SARS Coronavirus 2 by RT PCR POSITIVE (*)    All other components within normal  limits  CBC WITH DIFFERENTIAL/PLATELET - Abnormal; Notable for the following components:   MCHC 36.5 (*)    RDW 11.4 (*)    All other components within normal limits  COMPREHENSIVE METABOLIC PANEL - Abnormal; Notable for the following components:   Sodium 122 (*)    Potassium 2.8 (*)    Chloride 83 (*)    Glucose, Bld 110 (*)    BUN 5 (*)    All other components within normal limits  URINALYSIS, ROUTINE W REFLEX MICROSCOPIC - Abnormal; Notable for the following components:   Color, Urine COLORLESS (*)    Specific Gravity, Urine <1.005 (*)    Hgb urine dipstick SMALL (*)    Ketones, ur 15 (*)    All other components within normal limits  BASIC METABOLIC PANEL - Abnormal; Notable for the following components:   Sodium 126 (*)    Potassium 2.9 (*)    Chloride 88 (*)    Glucose, Bld 108 (*)    BUN 5 (*)    All other components within normal limits  BASIC METABOLIC PANEL - Abnormal; Notable for the following components:   Sodium 131 (*)    Potassium 3.4 (*)    Chloride 94 (*)    Glucose, Bld 149 (*)    BUN <5 (*)    All other components within normal limits  GLUCOSE, CAPILLARY - Abnormal; Notable for the following components:   Glucose-Capillary 106 (*)    All other components within normal limits  URINE CULTURE  LIPASE, BLOOD  MAGNESIUM  CBC WITH DIFFERENTIAL/PLATELET  BASIC METABOLIC PANEL  BASIC METABOLIC PANEL  BASIC METABOLIC PANEL  BASIC METABOLIC PANEL  BASIC METABOLIC PANEL  BASIC METABOLIC PANEL  TROPONIN I (HIGH SENSITIVITY)  TROPONIN I (HIGH SENSITIVITY)    EKG None  Radiology No results found.  Procedures Procedures   Medications Ordered in ED Medications  enoxaparin (LOVENOX) injection 40 mg (40 mg Subcutaneous Given 11/15/20 2030)  ondansetron (ZOFRAN) injection 4 mg (has no administration in time range)  hydrALAZINE (APRESOLINE) injection 5 mg (has no administration in time range)  lactated ringers bolus 1,000 mL (0 mLs Intravenous Stopped  11/15/20 1414)  dicyclomine (BENTYL) injection 20 mg (20 mg Intramuscular Given 11/15/20 1321)  alum & mag hydroxide-simeth (MAALOX/MYLANTA) 200-200-20 MG/5ML suspension 30 mL (30 mLs Oral Given 11/15/20 1317)    And  lidocaine (XYLOCAINE) 2 % viscous mouth solution 15 mL (15 mLs Oral Given 11/15/20 1317)  metoCLOPramide (REGLAN) injection 10 mg (10 mg Intravenous Given 11/15/20 1315)  diphenhydrAMINE (BENADRYL) injection 25 mg (25 mg Intravenous Given 11/15/20 1315)  methocarbamol (ROBAXIN) tablet 1,000 mg (1,000 mg Oral Given 11/15/20 1422)  potassium chloride SA (KLOR-CON) CR tablet 40 mEq (  40 mEq Oral Given 11/15/20 1435)  potassium chloride 10 mEq in 100 mL IVPB (10 mEq Intravenous Transfusing/Transfer 11/15/20 1716)  magnesium oxide (MAG-OX) tablet 400 mg (400 mg Oral Given 11/15/20 2030)    ED Course  I have reviewed the triage vital signs and the nursing notes.  Pertinent labs & imaging results that were available during my care of the patient were reviewed by me and considered in my medical decision making (see chart for details).    MDM Rules/Calculators/A&P                           70yo female with history of thyroid disease, tested positive for COVID June 25th (symptoms started 22nd), took the paxlovid 4 days ago and developed nausea, vomiting, generalized body aches presents with concern for continued nausea, vomiting, abdominal pain and decreased appetite.   Abdominal exam benign, no sign of SBO, cholecystitis, appendicitis. Suspect  pain secondary to gastritis.    Labs significant for hyponatremia with sodium 122, K 2.8, Given IV fluid, medications for symptom control, K. Patient discharged in stable condition with understanding of reasons to return.    Final Clinical Impression(s) / ED Diagnoses Final diagnoses:  Hyponatremia  COVID-19  Dehydration    Rx / DC Orders ED Discharge Orders     None        Alvira Monday, MD 11/16/20 [phone]

## 2020-11-16 DIAGNOSIS — U071 COVID-19: Secondary | ICD-10-CM

## 2020-11-16 DIAGNOSIS — E871 Hypo-osmolality and hyponatremia: Secondary | ICD-10-CM

## 2020-11-16 DIAGNOSIS — E039 Hypothyroidism, unspecified: Secondary | ICD-10-CM

## 2020-11-16 DIAGNOSIS — E876 Hypokalemia: Secondary | ICD-10-CM

## 2020-11-16 DIAGNOSIS — I10 Essential (primary) hypertension: Secondary | ICD-10-CM

## 2020-11-16 LAB — BASIC METABOLIC PANEL
Anion gap: 6 (ref 5–15)
Anion gap: 10 (ref 5–15)
BUN: 6 mg/dL — ABNORMAL LOW (ref 8–23)
BUN: 7 mg/dL — ABNORMAL LOW (ref 8–23)
CO2: 31 mmol/L (ref 22–32)
CO2: 28 mmol/L (ref 22–32)
Calcium: 8.9 mg/dL (ref 8.9–10.3)
Calcium: 9.3 mg/dL (ref 8.9–10.3)
Chloride: 97 mmol/L — ABNORMAL LOW (ref 98–111)
Chloride: 95 mmol/L — ABNORMAL LOW (ref 98–111)
Creatinine, Ser: 0.72 mg/dL (ref 0.44–1.00)
Creatinine, Ser: 0.65 mg/dL (ref 0.44–1.00)
GFR, Estimated: >60 mL/min (ref >60)
GFR, Estimated: >60 mL/min (ref >60)
Glucose, Bld: 98 mg/dL (ref 70–99)
Glucose, Bld: 106 mg/dL — ABNORMAL HIGH (ref 70–99)
Potassium: 3.6 mmol/L (ref 3.5–5.1)
Potassium: 3.6 mmol/L (ref 3.5–5.1)
Sodium: 134 mmol/L — ABNORMAL LOW (ref 135–145)
Sodium: 133 mmol/L — ABNORMAL LOW (ref 135–145)

## 2020-11-16 LAB — CBC WITH DIFFERENTIAL/PLATELET
Abs Immature Granulocytes: 0.01 10*3/uL (ref 0.00–0.07)
Basophils Absolute: 0 10*3/uL (ref 0.0–0.1)
Basophils Relative: 0 %
Eosinophils Absolute: 0 10*3/uL (ref 0.0–0.5)
Eosinophils Relative: 0 %
HCT: 40.5 % (ref 36.0–46.0)
Hemoglobin: 14.3 g/dL (ref 12.0–15.0)
Immature Granulocytes: 0 %
Lymphocytes Relative: 36 %
Lymphs Abs: 2.2 10*3/uL (ref 0.7–4.0)
MCH: 31.6 pg (ref 26.0–34.0)
MCHC: 35.3 g/dL (ref 30.0–36.0)
MCV: 89.6 fL (ref 80.0–100.0)
Monocytes Absolute: 0.9 10*3/uL (ref 0.1–1.0)
Monocytes Relative: 15 %
Neutro Abs: 2.9 10*3/uL (ref 1.7–7.7)
Neutrophils Relative %: 49 %
Platelets: 247 10*3/uL (ref 150–400)
RBC: 4.52 MIL/uL (ref 3.87–5.11)
RDW: 11.7 % (ref 11.5–15.5)
WBC: 6 10*3/uL (ref 4.0–10.5)
nRBC: 0 % (ref 0.0–0.2)

## 2020-11-16 LAB — URINE CULTURE

## 2020-11-16 MED ORDER — IPRATROPIUM-ALBUTEROL 20-100 MCG/ACT IN AERS
1.0000 | INHALATION_SPRAY | Freq: Four times a day (QID) | RESPIRATORY_TRACT | Status: DC
  Filled 2020-11-16: qty 4

## 2020-11-16 MED ORDER — AMLODIPINE BESYLATE 5 MG PO TABS: 5.0000 mg | ORAL_TABLET | Freq: Every day | ORAL | 0 refills | Status: AC

## 2020-11-16 MED ORDER — POTASSIUM CHLORIDE 20 MEQ PO PACK
40.0000 meq | PACK | Freq: Once | ORAL | Status: AC
  Administered 2020-11-16: 40 meq via ORAL
  Filled 2020-11-16: qty 2

## 2020-11-16 NOTE — Discharge Summary (Signed)
Physician Discharge Summary  Susan Rangel ZOX:[id number] DOB: January 17, 1951 DOA: 11/15/2020  PCP: Pieter Partridge., MD  Admit date: 11/15/2020 Discharge date: 11/16/2020  Admitted From: Home Disposition:  Home  Recommendations for Outpatient Follow-up:  Follow up with PCP in 1-2 weeks Please obtain BMP/CBC in one week your next doctors visit.  Discontinue chlorthalidone.  Start Norvasc for essential hypertension  Discharge Condition: Stable CODE STATUS: Full code Diet recommendation: 2 g salt diet  Brief/Interim Summary: 70 y.o. female with medical history significant for HTN, hypothyroidism, anemia and anxiety who presents with concerns of fatigue and weakness.   Patient reports that she started having some symptoms of fever and body ache around 21 June.  Husband at home was positive for COVID.  She then tested positive herself with a home test.  She saw her PCP through a virtual visit and was prescribed Paxlovid.  She took her first dose about 5 days into her COVID illness and states she had "violent cough" and began to have nausea, vomiting and diarrhea.  This lasted for about 2 days and symptoms have since resolved.  However afterwards she just felt "drained" and feels very weak.  She has decreased p.o. intake but has been trying to drink up to 4 bottles of water daily.  She has hypertension and has been taking her HCTZ as well.  She was found to hyponatremia and hypokalemia therefore admitted to the hospital.  With hydration and electrolyte repletion this resolved.  She was feeling great the following day therefore discharged in stable condition.  Chlorthalidone has been stopped started on Norvasc for now.  Body mass index is 22.13 kg/m.     Hypovolemic hyponatremia - Likely from decreased oral intake and chlorthalidone.  This is resolved for now.  Discontinue chlorthalidone.  Repeat lab work in 1 week outpatient Hypokalemia-resolved COVID-19 infection-no respiratory  symptoms, mild GI symptoms but improved.  Feeling well. Essential hypertension-discontinue chlorthalidone, Norvasc started Hypothyroidism-Synthroid    Discharge Diagnoses:  Principal Problem:   Hyponatremia Active Problems:   Hypokalemia   COVID-19 virus infection   HTN (hypertension)   Hypothyroidism       Subjective: Feels great no complaints  Discharge Exam: Vitals:   11/16/20 0228 11/16/20 0652  BP: (!) 140/93 (!) 143/83  Pulse: 62 71  Resp: 18 20  Temp: 98 F (36.7 C) 98.4 F (36.9 C)  SpO2: 98% 98%   Vitals:   11/15/20 1752 11/15/20 2206 11/16/20 0228 11/16/20 0652  BP: (!) 163/109 (!) 147/90 (!) 140/93 (!) 143/83  Pulse: 85 67 62 71  Resp: 18 20 18 20   Temp: 98.2 F (36.8 C) 97.8 F (36.6 C) 98 F (36.7 C) 98.4 F (36.9 C)  TempSrc: Oral Oral Oral Oral  SpO2: 97% 99% 98% 98%  Weight: 64.1 kg     Height: 5\' 7"  (1.702 m)       General: Pt is alert, awake, not in acute distress Cardiovascular: RRR, S1/S2 +, no rubs, no gallops Respiratory: CTA bilaterally, no wheezing, no rhonchi Abdominal: Soft, NT, ND, bowel sounds + Extremities: no edema, no cyanosis  Discharge Instructions   Allergies as of 11/16/2020   No Known Allergies      Medication List     STOP taking these medications    chlorthalidone 25 MG tablet Commonly known as: HYGROTON       TAKE these medications    amLODipine 5 MG tablet Commonly known as: NORVASC Take 1 tablet (5 mg total) by mouth daily.  Levothyroxine Sodium 175 MCG Caps Take 175 mcg by mouth every other day.   Tirosint 150 MCG Caps Generic drug: Levothyroxine Sodium Take 150 mcg by mouth every other day.        No Known Allergies  You were cared for by a hospitalist during your hospital stay. If you have any questions about your discharge medications or the care you received while you were in the hospital after you are discharged, you can call the unit and asked to speak with the hospitalist on  call if the hospitalist that took care of you is not available. Once you are discharged, your primary care physician will handle any further medical issues. Please note that no refills for any discharge medications will be authorized once you are discharged, as it is imperative that you return to your primary care physician (or establish a relationship with a primary care physician if you do not have one) for your aftercare needs so that they can reassess your need for medications and monitor your lab values.   Procedures/Studies: No results found.   The results of significant diagnostics from this hospitalization (including imaging, microbiology, ancillary and laboratory) are listed below for reference.     Microbiology: Recent Results (from the past 240 hour(s))  Urine culture     Status: Abnormal   Collection Time: 11/15/20  1:00 PM   Specimen: Urine, Random  Result Value Ref Range Status   Specimen Description   Final    URINE, RANDOM Performed at [facility], [address], [place], [place] [address]    Special Requests   Final    NONE Performed at [facility], [address], [place], [place] [address]    Culture MULTIPLE SPECIES PRESENT, SUGGEST RECOLLECTION (A)  Final   Report Status 11/16/2020 FINAL  Final  Resp Panel by RT-PCR (Flu A&B, Covid) Nasopharyngeal Swab     Status: Abnormal   Collection Time: 11/15/20  2:38 PM   Specimen: Nasopharyngeal Swab; Nasopharyngeal(NP) swabs in vial transport medium  Result Value Ref Range Status   SARS Coronavirus 2 by RT PCR POSITIVE (A) NEGATIVE Final    Comment: RESULT CALLED TO, READ BACK BY AND VERIFIED WITH: MADISON FOUNTAIN RN 11/15/20 1534 SRC (NOTE) SARS-CoV-2 target nucleic acids are DETECTED.  The SARS-CoV-2 RNA is generally detectable in upper respiratory specimens during the acute phase of infection. Positive results are indicative of the presence of the identified virus, but do  not rule out bacterial infection or co-infection with other pathogens not detected by the test. Clinical correlation with patient history and other diagnostic information is necessary to determine patient infection status. The expected result is Negative.  Fact Sheet for Patients: BloggerCourse.com  Fact Sheet for Healthcare Providers: SeriousBroker.it  This test is not yet approved or cleared by the [place] FDA and  has been authorized for detection and/or diagnosis of SARS-CoV-2 by FDA under an Emergency Use Authorization (EUA).  This EUA will remain in effect (meaning this test can  be used) for the duration of  the COVID-19 declaration under Section 564(b)(1) of the Act, 21 U.S.C. section 360bbb-3(b)(1), unless the authorization is terminated or revoked sooner.     Influenza A by PCR NEGATIVE NEGATIVE Final   Influenza B by PCR NEGATIVE NEGATIVE Final    Comment: (NOTE) The Xpert Xpress SARS-CoV-2/FLU/RSV plus assay is intended as an aid in the diagnosis of influenza from Nasopharyngeal swab specimens and should not be used as a sole basis for  treatment. Nasal washings and aspirates are unacceptable for Xpert Xpress SARS-CoV-2/FLU/RSV testing.  Fact Sheet for Patients: BloggerCourse.com  Fact Sheet for Healthcare Providers: SeriousBroker.it  This test is not yet approved or cleared by the [place] FDA and has been authorized for detection and/or diagnosis of SARS-CoV-2 by FDA under an Emergency Use Authorization (EUA). This EUA will remain in effect (meaning this test can be used) for the duration of the COVID-19 declaration under Section 564(b)(1) of the Act, 21 U.S.C. section 360bbb-3(b)(1), unless the authorization is terminated or revoked.  Performed at [facility], [address], [place], [place] [address]      Labs: BNP (last 3  results) No results for input(s): BNP in the last 8760 hours. Basic Metabolic Panel: Recent Labs  Lab 11/15/20 1215 11/15/20 1519 11/15/20 1957 11/16/20 0207 11/16/20 0635  NA 122* 126* 131* 134* 133*  K 2.8* 2.9* 3.4* 3.6 3.6  CL 83* 88* 94* 97* 95*  CO2 29 28 28 31 28   GLUCOSE 110* 108* 149* 98 106*  BUN 5* 5* <5* 6* 7*  CREATININE 0.57 0.54 0.57 0.72 0.65  CALCIUM 9.3 9.0 8.9 8.9 9.3  MG 1.8  --   --   --   --    Liver Function Tests: Recent Labs  Lab 11/15/20 1215  AST 30  ALT 30  ALKPHOS 59  BILITOT 0.9  PROT 6.7  ALBUMIN 4.3   Recent Labs  Lab 11/15/20 1215  LIPASE 32   No results for input(s): AMMONIA in the last 168 hours. CBC: Recent Labs  Lab 11/15/20 1215 11/16/20 0207  WBC 4.3 6.0  NEUTROABS 2.5 2.9  HGB 15.0 14.3  HCT 41.1 40.5  MCV 86.5 89.6  PLT 233 247   Cardiac Enzymes: No results for input(s): CKTOTAL, CKMB, CKMBINDEX, TROPONINI in the last 168 hours. BNP: Invalid input(s): POCBNP CBG: Recent Labs  Lab 11/15/20 2208  GLUCAP 106*   D-Dimer No results for input(s): DDIMER in the last 72 hours. Hgb A1c No results for input(s): HGBA1C in the last 72 hours. Lipid Profile No results for input(s): CHOL, HDL, LDLCALC, TRIG, CHOLHDL, LDLDIRECT in the last 72 hours. Thyroid function studies No results for input(s): TSH, T4TOTAL, T3FREE, THYROIDAB in the last 72 hours.  Invalid input(s): FREET3 Anemia work up No results for input(s): VITAMINB12, FOLATE, FERRITIN, TIBC, IRON, RETICCTPCT in the last 72 hours. Urinalysis    Component Value Date/Time   COLORURINE COLORLESS (A) 11/15/2020 1300   APPEARANCEUR CLEAR 11/15/2020 1300   LABSPEC <1.005 (L) 11/15/2020 1300   PHURINE 7.0 11/15/2020 1300   GLUCOSEU NEGATIVE 11/15/2020 1300   HGBUR SMALL (A) 11/15/2020 1300   BILIRUBINUR NEGATIVE 11/15/2020 1300   KETONESUR 15 (A) 11/15/2020 1300   PROTEINUR NEGATIVE 11/15/2020 1300   NITRITE NEGATIVE 11/15/2020 1300   LEUKOCYTESUR NEGATIVE  11/15/2020 1300   Sepsis Labs Invalid input(s): PROCALCITONIN,  WBC,  LACTICIDVEN Microbiology Recent Results (from the past 240 hour(s))  Urine culture     Status: Abnormal   Collection Time: 11/15/20  1:00 PM   Specimen: Urine, Random  Result Value Ref Range Status   Specimen Description   Final    URINE, RANDOM Performed at [facility], [address] [place], [place]    Special Requests   Final    NONE Performed at [facility], [address], [place], [place]    Culture MULTIPLE SPECIES PRESENT, SUGGEST RECOLLECTION (A)  Final   Report Status  11/16/2020 FINAL  Final  Resp Panel by RT-PCR (Flu A&B, Covid) Nasopharyngeal Swab     Status: Abnormal   Collection Time: 11/15/20  2:38 PM   Specimen: Nasopharyngeal Swab; Nasopharyngeal(NP) swabs in vial transport medium  Result Value Ref Range Status   SARS Coronavirus 2 by RT PCR POSITIVE (A) NEGATIVE Final    Comment: RESULT CALLED TO, READ BACK BY AND VERIFIED WITH: MADISON FOUNTAIN RN 11/15/20 1534 SRC (NOTE) SARS-CoV-2 target nucleic acids are DETECTED.  The SARS-CoV-2 RNA is generally detectable in upper respiratory specimens during the acute phase of infection. Positive results are indicative of the presence of the identified virus, but do not rule out bacterial infection or co-infection with other pathogens not detected by the test. Clinical correlation with patient history and other diagnostic information is necessary to determine patient infection status. The expected result is Negative.  Fact Sheet for Patients: BloggerCourse.comhttps://www.fda.gov/media/152166/download  Fact Sheet for Healthcare Providers: SeriousBroker.ithttps://www.fda.gov/media/152162/download  This test is not yet approved or cleared by the [place] FDA and  has been authorized for detection and/or diagnosis of SARS-CoV-2 by FDA under an Emergency Use Authorization (EUA).  This EUA will remain in effect  (meaning this test can  be used) for the duration of  the COVID-19 declaration under Section 564(b)(1) of the Act, 21 U.S.C. section 360bbb-3(b)(1), unless the authorization is terminated or revoked sooner.     Influenza A by PCR NEGATIVE NEGATIVE Final   Influenza B by PCR NEGATIVE NEGATIVE Final    Comment: (NOTE) The Xpert Xpress SARS-CoV-2/FLU/RSV plus assay is intended as an aid in the diagnosis of influenza from Nasopharyngeal swab specimens and should not be used as a sole basis for treatment. Nasal washings and aspirates are unacceptable for Xpert Xpress SARS-CoV-2/FLU/RSV testing.  Fact Sheet for Patients: BloggerCourse.comhttps://www.fda.gov/media/152166/download  Fact Sheet for Healthcare Providers: SeriousBroker.ithttps://www.fda.gov/media/152162/download  This test is not yet approved or cleared by the [place] FDA and has been authorized for detection and/or diagnosis of SARS-CoV-2 by FDA under an Emergency Use Authorization (EUA). This EUA will remain in effect (meaning this test can be used) for the duration of the COVID-19 declaration under Section 564(b)(1) of the Act, 21 U.S.C. section 360bbb-3(b)(1), unless the authorization is terminated or revoked.  Performed at [facility], [address], [place], [place] [address]      Time coordinating discharge:  I have spent 35 minutes face to face with the patient and on the ward discussing the patients care, assessment, plan and disposition with other care givers. >50% of the time was devoted counseling the patient about the risks and benefits of treatment/Discharge disposition and coordinating care.   SIGNED:   Dimple NanasAnkit Chirag Alexy Heldt, MD  [facility] 11/16/2020, 12:00 PM   If 7PM-7AM, please contact night-coverage

## 2020-11-16 NOTE — Care Management Obs Status (Signed)
MEDICARE OBSERVATION STATUS NOTIFICATION   Patient Details  Name: Susan Rangel MRN: [id number] Date of Birth: 05-15-1951   Medicare Observation Status Notification Given:  Yes    Armanda Heritage, RN 11/16/2020, 9:20 AM

## 2020-11-16 NOTE — Care Management CC44 (Signed)
Condition Code 44 Documentation Completed  Patient Details  Name: KINBERLY PERRIS MRN: [id number] Date of Birth: 04-26-1951   Condition Code 44 given:  Yes Patient signature on Condition Code 44 notice:  Yes Documentation of 2 MD's agreement:  Yes Code 44 added to claim:  Yes    Armanda Heritage, RN 11/16/2020, 9:20 AM

## 2020-11-16 NOTE — H&P (Signed)
History and Physical    Susan Rangel GDJ:[id number] DOB: [birth year] DOA: 11/15/2020  PCP: Pieter Partridge., MD  [facility]   I have personally briefly reviewed patient's old medical records in [facility]  Chief Complaint: fatigue and weakness  HPI: Susan Rangel is a 70 y.o. female with medical history significant for HTN, hypothyroidism, anemia and anxiety who presents with concerns of fatigue and weakness.  Patient reports that she started having some symptoms of fever and body ache around 21 June.  Husband at home was positive for COVID.  She then tested positive herself with a home test.  She saw her PCP through a virtual visit and was prescribed Paxlovid.  She took her first dose about 5 days into her COVID illness and states she had "violent cough" and began to have nausea, vomiting and diarrhea.  This lasted for about 2 days and symptoms have since resolved.  However afterwards she just felt "drained" and feels very weak.  She has decreased p.o. intake but has been trying to drink up to 4 bottles of water daily.  She has hypertension and has been taking her HCTZ as well.  ED Course: She was afebrile, initially hypertensive with systolic up to 170s.  CBC unremarkable.  Sodium of 122.  BG of 108.  Potassium of 2.9.  Review of Systems: Constitutional: No Weight Change, No Fever ENT/Mouth: No sore throat, No Rhinorrhea Eyes: No Eye Pain, No Vision Changes Cardiovascular: No Chest Pain, no SOB Respiratory: No Cough, No Sputum Gastrointestinal: No Nausea, No Vomiting, No Diarrhea, No Constipation, No Pain Genitourinary: no Urinary Incontinenc Musculoskeletal: No Arthralgias, No Myalgias Skin: No Skin Lesions, No Pruritus, Neuro: + Weakness, No Numbness Psych: No Anxiety/Panic, No Depression, + decrease appetite Heme/Lymph: No Bruising, No Bleeding Past Medical History:  Diagnosis Date   Thyroid disease     Past Surgical  History:  Procedure Laterality Date   ABDOMINAL SURGERY       has no history on file for tobacco use, alcohol use, and drug use. Social History  No Known Allergies  History Husband positive COVID   Prior to Admission medications   Medication Sig Start Date End Date Taking? Authorizing Provider  chlorthalidone (HYGROTON) 25 MG tablet Take 25 mg by mouth daily. 08/06/20  Yes [provider]  Levothyroxine Sodium 175 MCG CAPS Take 175 mcg by mouth every other day.   Yes [provider]  TIROSINT 150 MCG CAPS Take 150 mcg by mouth every other day. 10/08/20  Yes [provider]    Physical Exam: Vitals:   11/15/20 1400 11/15/20 1600 11/15/20 1752 11/15/20 2206  BP: (!) 153/105 (!) 157/98 (!) 163/109 (!) 147/90  Pulse: 78 84 85 67  Resp: 17 17 18 20   Temp:   98.2 F (36.8 C) 97.8 F (36.6 C)  TempSrc:   Oral Oral  SpO2: 98% 99% 97% 99%  Weight:   64.1 kg   Height:   5\' 7"  (1.702 m)     Constitutional: NAD, calm, comfortable, non-toxic appearing elderly female laying flat in bed Vitals:   11/15/20 1400 11/15/20 1600 11/15/20 1752 11/15/20 2206  BP: (!) 153/105 (!) 157/98 (!) 163/109 (!) 147/90  Pulse: 78 84 85 67  Resp: 17 17 18 20   Temp:   98.2 F (36.8 C) 97.8 F (36.6 C)  TempSrc:   Oral Oral  SpO2: 98% 99% 97% 99%  Weight:   64.1 kg   Height:  5\' 7"  (1.702 m)    Eyes: PERRL, lids and conjunctivae normal ENMT: Mucous membranes are moist.  Neck: normal, supple Respiratory: clear to auscultation bilaterally, no wheezing, no crackles. Normal respiratory effort. No accessory muscle use.  Cardiovascular: Regular rate and rhythm, no murmurs / rubs / gallops. Non-pitting bilateral distal lower extremities  Abdomen: no tenderness, no masses palpated.  Bowel sounds positive.  Musculoskeletal: no clubbing / cyanosis. No joint deformity upper and lower extremities. Good ROM, no contractures. Normal muscle tone.  Skin: no rashes, lesions, ulcers. No  induration Neurologic: CN 2-12 grossly intact. Sensation intact, Strength 5/5 in all 4.  Psychiatric: Normal judgment and insight. Alert and oriented x 3. Normal mood.     Labs on Admission: I have personally reviewed following labs and imaging studies  CBC: Recent Labs  Lab 11/15/20 1215  WBC 4.3  NEUTROABS 2.5  HGB 15.0  HCT 41.1  MCV 86.5  PLT 233   Basic Metabolic Panel: Recent Labs  Lab 11/15/20 1215 11/15/20 1519 11/15/20 1957  NA 122* 126* 131*  K 2.8* 2.9* 3.4*  CL 83* 88* 94*  CO2 29 28 28   GLUCOSE 110* 108* 149*  BUN 5* 5* <5*  CREATININE 0.57 0.54 0.57  CALCIUM 9.3 9.0 8.9  MG 1.8  --   --    GFR: Estimated Creatinine Clearance: 65.5 mL/min (by C-G formula based on SCr of 0.57 mg/dL). Liver Function Tests: Recent Labs  Lab 11/15/20 1215  AST 30  ALT 30  ALKPHOS 59  BILITOT 0.9  PROT 6.7  ALBUMIN 4.3   Recent Labs  Lab 11/15/20 1215  LIPASE 32   No results for input(s): AMMONIA in the last 168 hours. Coagulation Profile: No results for input(s): INR, PROTIME in the last 168 hours. Cardiac Enzymes: No results for input(s): CKTOTAL, CKMB, CKMBINDEX, TROPONINI in the last 168 hours. BNP (last 3 results) No results for input(s): PROBNP in the last 8760 hours. HbA1C: No results for input(s): HGBA1C in the last 72 hours. CBG: Recent Labs  Lab 11/15/20 2208  GLUCAP 106*   Lipid Profile: No results for input(s): CHOL, HDL, LDLCALC, TRIG, CHOLHDL, LDLDIRECT in the last 72 hours. Thyroid Function Tests: No results for input(s): TSH, T4TOTAL, FREET4, T3FREE, THYROIDAB in the last 72 hours. Anemia Panel: No results for input(s): VITAMINB12, FOLATE, FERRITIN, TIBC, IRON, RETICCTPCT in the last 72 hours. Urine analysis:    Component Value Date/Time   COLORURINE COLORLESS (A) 11/15/2020 1300   APPEARANCEUR CLEAR 11/15/2020 1300   LABSPEC <1.005 (L) 11/15/2020 1300   PHURINE 7.0 11/15/2020 1300   GLUCOSEU NEGATIVE 11/15/2020 1300   HGBUR  SMALL (A) 11/15/2020 1300   BILIRUBINUR NEGATIVE 11/15/2020 1300   KETONESUR 15 (A) 11/15/2020 1300   PROTEINUR NEGATIVE 11/15/2020 1300   NITRITE NEGATIVE 11/15/2020 1300   LEUKOCYTESUR NEGATIVE 11/15/2020 1300    Radiological Exams on Admission: No results found.    Assessment/Plan  Hyponatremia -secondary to decrease PO intake and taking chlorthalidone -Na of 122. This has improved significantly with continuous IV fluid.  Will DC IV fluid for now to avoid rapid overcorrection -continue to monitor BMP -hold chlorthalidone  Hypokalemia - Replete with IV and p.o. potassium  COVID viral infection -no respiratory symptoms at this time.  Continue airborne and contact precaution isolation  HTN -Hold HCTZ due to hyponatremia stated above -prn hydralazine 5 mg q6hr for BP systolic greater than 170 and her diastolic greater than 110  Hypothyroidism -pt has two different dosage of  levothyroxine listed as every other day. Will need to re-verify whether she takes one daily with the other being QOD.   DVT prophylaxis:.Lovenox Code Status: Full Family Communication: Plan discussed with patient at bedside  disposition Plan: Home with at least 2 midnight stays  Consults called:  Admission status: inpatient   Level of care: Progressive  Status is: Inpatient  Remains inpatient appropriate because:Inpatient level of care appropriate due to severity of illness  Dispo: The patient is from: Home              Anticipated d/c is to: Home              Patient currently is not medically stable to d/c.   Difficult to place patient No         Anselm Jungling DO [facility]   If 7PM-7AM, please contact night-coverage [email or web address]   11/16/2020, 12:51 AM

## 2021-02-19 ENCOUNTER — Ambulatory Visit: Payer: Medicare HMO | Admitting: Family Medicine

## 2021-02-19 VITALS — Ht 67.0 in | Wt 145.0 lb

## 2021-02-19 DIAGNOSIS — M25571 Pain in right ankle and joints of right foot: Secondary | ICD-10-CM

## 2021-02-19 NOTE — Patient Instructions (Signed)
You need the additional padding in the right inserts - either John or we can do this for you, just let us know. After a couple weeks using these you can start doing the home exercises for the post tib tendon again. Icing 15 minutes at a time 3-4 times a day, elevation to help with the swelling. Consider nitro patches if not improving as expected. Follow up with me in 6 weeks or as needed if you steadily improve.

## 2021-02-19 NOTE — Progress Notes (Signed)
PCP: Pieter Partridge., MD  Subjective:   HPI: Patient is a 70 y.o. female with history of left posterior tibialis tendon rupture here for right ankle pain.  4/25: Patient has prior history of left posterior tibialis tendon rupture and issues dating back to 2000. At the time she may have turned ankle and has history of known flat feet. She saw an orthopedic in [place], put in boot then a brace and finally an orthotic. Did well until about 2005 when she saw Dr. Prince Rome for same issue - may have been placed in a boot at that time because she found a second boot at home, did physical therapy and improved. Current issue started in November when she stubbed her left 5th digit. She had a lot of swelling and bruising distal dorsal foot at the time and subsequently developed pain medial left ankle in same location as prior post tib problems. Using orthofeet shoes which help a lot with her foot pain but has noticed anterior knee pain with these.  6/13: Patient reports she's improving with physical therapy, home exercises, and her orthotics. Additional long arch support and medial heel wedge were added to left insert. She has noticed some increased pain right ankle medially. In most recent PT visit Friday had deep tissue work and this worsened her pain though it's improving. Less pain when not wearing shoes and does feel more stable than previously.  10/4: Patient prescribes right ankle pain back in July and does not recall a inciting event.  The achy pain is located on the medial aspect of her ankle and constant. The pain is increased on days she walks a lot or stands for a long time. Pain is improved with icing ankle and rest. Her left ankle pain has improved with therapy prescribed at last visit.   Ht 5\' 7"  (1.702 m)   Wt 145 lb (65.8 kg)   BMI 22.71 kg/m   [facility] 02/19/2021  Frequency of aerobic exercise (# of days/week) 0  Average time in minutes 0   Frequency of strengthening activities (# of days/week) 0    Review of Systems: See HPI above.     Objective:  Physical Exam:  Gen: NAD, comfortable in exam room Right ankle: No gross deformity, swelling, ecchymoses FROM, pain with medial rotation. No pain with flexion of great toe TTP behind medial malleolus, no TTP of calf or achilles tendon   NV intact distally.  Limited MSK u/s right ankle: no cortical irregularity of medial malleolus.  Moderate tenosynovitis of post tib tendon with increased neovascularity.  Assessment & Plan:  1. Right ankle pain - 2/2 posterior tibialis tendinitis confirmed on Dr.Hudnall's ultrasound exam.  - Recommended additional padding in right insert, patient will follow up with person who made her orthotics - Icing 15 minutes 3-5 times a day and elevation to help with swelling.  - Begin home exercises for right post tib tendon in 2 weeks.  - Follow up in 6 weeks or as needed, if not improving as expected will consider nitro patches.   04/21/2021 PGY-3

## 2022-09-09 ENCOUNTER — Other Ambulatory Visit: Payer: Self-pay | Admitting: *Deleted

## 2022-09-09 DIAGNOSIS — Z13820 Encounter for screening for osteoporosis: Secondary | ICD-10-CM
